# Patient Record
Sex: Female | Born: 1995 | Race: Black or African American | Hispanic: No | Marital: Single | Smoking: Never smoker
Health system: Southern US, Community
[De-identification: ages and names within clinical notes are randomized; demographics above are authoritative.]

---
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
## 2015-03-19 ENCOUNTER — Encounter (HOSPITAL_COMMUNITY): Payer: Self-pay | Admitting: Emergency Medicine

## 2015-03-19 ENCOUNTER — Emergency Department (HOSPITAL_COMMUNITY)
Admission: EM | Admit: 2015-03-19 | Discharge: 2015-03-19 | Disposition: A | Discharge status: Home / Self Care | Payer: PRIVATE HEALTH INSURANCE | Attending: Emergency Medicine | Admitting: Emergency Medicine

## 2015-03-19 ENCOUNTER — Emergency Department (HOSPITAL_COMMUNITY): Payer: PRIVATE HEALTH INSURANCE

## 2015-03-19 DIAGNOSIS — R05 Cough: Secondary | ICD-10-CM | POA: Diagnosis present

## 2015-03-19 DIAGNOSIS — R059 Cough, unspecified: Secondary | ICD-10-CM

## 2015-03-19 DIAGNOSIS — R0789 Other chest pain: Secondary | ICD-10-CM | POA: Diagnosis not present

## 2015-03-19 DIAGNOSIS — R062 Wheezing: Secondary | ICD-10-CM | POA: Insufficient documentation

## 2015-03-19 MED ORDER — BENZONATATE 100 MG PO CAPS: 200.0000 mg | ORAL_CAPSULE | Freq: Two times a day (BID) | ORAL | Status: AC | PRN

## 2015-03-19 MED ORDER — FAMOTIDINE 20 MG PO TABS: 20.0000 mg | ORAL_TABLET | Freq: Two times a day (BID) | ORAL | Status: AC

## 2015-03-19 NOTE — ED Provider Notes (Signed)
CSN: [id number]     Arrival date & time 03/19/15  1712 History  By signing my name below, I, Gonzella Lex, attest that this documentation has been prepared under the direction and in the presence of Barrett Henle, PA-C. Electronically Signed: Gonzella Lex, Scribe. 03/19/2015. 7:31 PM.   Chief Complaint  Patient presents with  . Cough   The history is provided by the patient. No language interpreter was used.   HPI Comments: Mary Ashley is a 20 y.o. female with no pertinent past medical hx who presents to the Emergency Department complaining of nonproductive cough, onset 2 months. She notes her coughing when laying day at night. Pt also reports having mild, substernal chest pain, described as tightness. Pt also notes associated intermittent wheezing. Pt was seen by the nurse on her school's campus who prescribed her prednisone and cough medication which have provided her with no relief. She denies fever, nasal congestion, rhinorrhea, sore throat, hemoptysis, ear pain, SOB, abdominal pain, nausea, vomiting, diarrhea, watery eyes, recent injury to her chest, and hx of seasonal allergies.  Denies taking any other medications PTA.  History reviewed. No pertinent past medical history. History reviewed. No pertinent past surgical history. No family history on file. Social History  Substance Use Topics  . Smoking status: Never Smoker   . Smokeless tobacco: None  . Alcohol Use: No   OB History    No data available     Review of Systems  Constitutional: Negative for fever.  HENT: Negative for congestion, ear pain, rhinorrhea and sore throat.   Eyes: Negative for discharge.  Respiratory: Positive for cough and chest tightness.   Cardiovascular: Positive for chest pain.  Gastrointestinal: Negative for nausea, vomiting, abdominal pain and diarrhea.   Allergies  Review of patient's allergies indicates no known allergies.  Home Medications   Prior to Admission  medications   Medication Sig Start Date End Date Taking? Authorizing Provider  benzonatate (TESSALON) 100 MG capsule Take 2 capsules (200 mg total) by mouth 2 (two) times daily as needed for cough. 03/19/15   Barrett Henle, PA-C  famotidine (PEPCID) 20 MG tablet Take 1 tablet (20 mg total) by mouth 2 (two) times daily. 03/19/15   Satira Sark Nadeau, PA-C   BP 117/79 mmHg  Pulse 75  Temp(Src) 98.5 F (36.9 C) (Oral)  Resp 18  SpO2 99%  LMP 02/27/2015 Physical Exam  Constitutional: She is oriented to person, place, and time. She appears well-developed and well-nourished. No distress.  HENT:  Head: Normocephalic and atraumatic.  Right Ear: External ear normal.  Left Ear: External ear normal.  Nose: Nose normal.  Mouth/Throat: Oropharynx is clear and moist. No oropharyngeal exudate.  Eyes: Conjunctivae and EOM are normal. Right eye exhibits no discharge. Left eye exhibits no discharge. No scleral icterus.  Neck: Normal range of motion. Neck supple.  Cardiovascular: Normal rate, regular rhythm, normal heart sounds and intact distal pulses.  Exam reveals no gallop and no friction rub.   No murmur heard. Pulmonary/Chest: Effort normal and breath sounds normal. No respiratory distress. She has no wheezes. She has no rales. She exhibits tenderness ( anterior chest wall mildly tender with palpation  ).  Abdominal: Soft. Bowel sounds are normal. She exhibits no distension. There is no tenderness.  Musculoskeletal: Normal range of motion. She exhibits no edema.  Neurological: She is alert and oriented to person, place, and time.  Skin: Skin is warm and dry.  Psychiatric: She has a normal mood  and affect.  Nursing note and vitals reviewed.   ED Course  Procedures  DIAGNOSTIC STUDIES:    Oxygen Saturation is 98% on RA, normal by my interpretation.   COORDINATION OF CARE:  7:31 PM Will prescribe pt pepcid and cough medication. Discussed treatment plan with pt at bedside and pt  agreed to plan.   Imaging Review Dg Chest 2 View  03/19/2015  CLINICAL DATA:  Nonproductive cough for 2 months, central chest pain when coughing EXAM: CHEST  2 VIEW COMPARISON:  None. FINDINGS: Cardiomediastinal silhouette is unremarkable. No acute infiltrate or pleural effusion. No pulmonary edema. Bilateral nipple metallic pins. Bony thorax is unremarkable. IMPRESSION: No active cardiopulmonary disease. Electronically Signed   By: Natasha Mead M.D.   On: 03/19/2015 18:43   I have personally reviewed and evaluated these images as part of my medical decision-making.   MDM   Final diagnoses:  Cough    Pt presents with nonproductive cough x 2months with associated CP with coughing. No PMH. VSS. Exam revealed mild tenderness over anterior chest wall, remaining exam unremarkable. I suspect pt's cough may likely be due to GERD due to nonproductive cough worsening at night or when laying down s/p meal. Plan to d/c pt home with antitussive and pepcid. Pt given resource guide to follow up with PCP.   Evaluation does not show pathology requring ongoing emergent intervention or admission. Pt is hemodynamically stable and mentating appropriately. Discussed findings/results and plan with patient/guardian, who agrees with plan. All questions answered. Return precautions discussed and outpatient follow up given.    I personally performed the services described in this documentation, which was scribed in my presence. The recorded information has been reviewed and is accurate.    Satira Sark [place], [place] 03/20/15 1036  Bethann Berkshire, MD 03/22/15 [phone]

## 2015-03-19 NOTE — ED Notes (Addendum)
Pt complaint of continued nonproductive cough for two month; central chest pain ONLY with cough; denies fever.

## 2015-03-19 NOTE — Discharge Instructions (Signed)
Take your medications as prescribed. Please follow up with a primary care provider from the Resource Guide provided below in 1 week. Please return to the Emergency Department if symptoms worsen or new onset of fever, productive cough, coughing up blood, shortness of breath, wheezing, chest pain, lightheadedness, dizziness, syncope.   Emergency Department Resource Guide 1) Find a Doctor and Pay Out of Pocket Although you won't have to find out who is covered by your insurance plan, it is a good idea to ask around and get recommendations. You will then need to call the office and see if the doctor you have chosen will accept you as a new patient and what types of options they offer for patients who are self-pay. Some doctors offer discounts or will set up payment plans for their patients who do not have insurance, but you will need to ask so you aren't surprised when you get to your appointment.  2) Contact Your Local Health Department Not all health departments have doctors that can see patients for sick visits, but many do, so it is worth a call to see if yours does. If you don't know where your local health department is, you can check in your phone book. The CDC also has a tool to help you locate your state's health department, and many state websites also have listings of all of their local health departments.  3) Find a Walk-in Clinic If your illness is not likely to be very severe or complicated, you may want to try a walk in clinic. These are popping up all over the country in pharmacies, drugstores, and shopping centers. They're usually staffed by nurse practitioners or physician assistants that have been trained to treat common illnesses and complaints. They're usually fairly quick and inexpensive. However, if you have serious medical issues or chronic medical problems, these are probably not your best option.  No Primary Care Doctor: - Call Health Connect at  [phone] - they can help you  locate a primary care doctor that  accepts your insurance, provides certain services, etc. - Physician Referral Service- [phone]  Chronic Pain Problems: Organization         Address  Phone   Notes  [facility]  [phone] Patients need to be referred by their primary care doctor.   Medication Assistance: Organization         Address  Phone   Notes  [facility] Medication [facility] [address] [place], [place] [address] [phone] --Must be a resident of [facility] -- Must have NO insurance coverage whatsoever (no Medicaid/ Medicare, etc.) -- The pt. MUST have a primary care doctor that directs their care regularly and follows them in the community   [facility]ist  [phone]   [employer]  [phone]    Agencies that provide inexpensive medical care: Organization         Address  Phone   Notes  [facility]  [phone]   [facility]    [phone]   [facility] [address] [place], [place] [address] [phone]   [facility] [address] [place]. [address], [place] [phone]   Planned Parenthood    [phone]   [facility]    [phone]   [facility]  [address], [place] Phone:  [phone], Fax:  [phone] Hours of Operation:  9  am - 6 pm, M-F.  Also accepts Medicaid/Medicare and self-pay.  [facility]  [address], [place] Phone: [phone]) [phone], Fax: [phone]) [phone]. Hours of Operation:  8:30 am - 5:30 pm, M-F.  Also accepts Medicaid and self-pay.  [facility] [place] [address], [place] Phone: [phone]) [phone]   [facility] [address], [place], [place] [phone], Ext. [phone] Mondays & Thursdays: 7-9 AM.  First 15 patients are seen on a first come, first serve basis.    Medicaid-accepting [facility]  Providers:  Organization         Address  Phone   Notes  [facility] [address], [place] [phone]) [phone] Also accepts self-pay patients.  [facility] [address] [place], [place]  [phone]) [phone]   [facility] [address], [place] [phone](336) [phone]   [facility] [address] [place] [address], [place] [phone](336) [phone]   Renaye RakersVeita Bland [address], [place]   [phone] Only accepts [place] Access [place] patients after they have their name applied to their card.   Self-Pay (no insurance) in [facility]:  Organization         Address  Phone   Notes  Sickle Cell Patients, [facility] [address], [place] [phone]) [phone]   [facility] [address], [place] [phone]) [phone]   [facility] [place]  [address], [place] [phone]) [phone]   [facility]/Dr. Osei-Bonsu  [address] [place] [address], [place] or [phone], [place] [phone]) [phone] Phone number for both [place] and [place] locations is the same.  [facility] [address], [place] [phone]) [phone]   [facility] [place] [address] [place] [address], [place] or [address] [phone]) [phone]   [facility] [address], [place] [phone]) [phone], phone; [phone]) [phone], fax Sees patients 1st and 3rd Saturday of every month.  Must not qualify for public or private insurance (i.e. Medicaid, Medicare, [place] Health Choice, Veterans' Benefits)  Household income should be no more than 200% of the poverty level The clinic cannot treat you if you are pregnant or think you are pregnant  Sexually transmitted diseases are not treated at the clinic.    Dental Care: Organization         Address  Phone  Notes  [facility] [address], [place] [phone] Accepts children up to age 20 who are enrolled in [place]icaid or [place] Health Choice; pregnant women with a Medicaid card; and children who have applied for Medicaid or [place] Health Choice, but were declined, whose parents can pay a reduced fee at time of service.  [facility] [place]  [address], [place] [phone] Accepts children up to age 20 who are enrolled in [place] or [place] Health Choice; pregnant women with a Medicaid card; and children who have applied for Medicaid or [place] Health Choice, but were declined, whose parents can pay a reduced fee at time of service.  [facility]  [address], [place] [phone]) [phone] Patients are seen by appointment only. Walk-ins are not accepted. [facility] will see patients 18 years of  age and older. Monday - Tuesday (8am-5pm) Most Wednesdays (8:30-5pm) $30 per visit, cash only  [facility]  [address], [facility] [phone] Patients are seen by appointment only. Walk-ins are not accepted. [place] will see patients 67 years of age and older. One Wednesday Evening (Monthly: Volunteer Based).  $30 per visit, cash only  [place]  [phone] for adults; Children under age 56, call Graduate Pediatric Dentistry at [phone]. Children aged 3-14, please call [phone] to request a pediatric application.  Dental services are provided in all areas of dental care including fillings, crowns and bridges, complete and partial dentures, implants, gum treatment, root canals, and extractions. Preventive care is also provided. Treatment is provided to both adults and children. Patients are selected via a lottery and there is often a waiting list.   [facility] [address] [place]  [phone] [email or web address]   [facility]  [address] [place], [place] [phone], Ext. [phone] Second and Fourth Thursday of each month, opens at 6:30 AM; Clinic ends at 9 AM.  Patients are seen on a first-come first-served basis, and a limited number are seen during each clinic.   [facility]  [address] Danker [place], [place] [phone]   Eligibility Requirements You must have lived in [place], [place], or [place] counties for at least the last three months.   You cannot be eligible for state or federal sponsored [employer], including [employer], [place], or [employer].   You generally cannot be eligible for healthcare insurance through your employer.    How to apply: Eligibility screenings are held every Tuesday and Wednesday afternoon from 1:00 pm until 4:00 pm. You do not need an appointment for the interview!  [facility] [address] [place], [place]   [place]  [place] Department  [place]  [phone]    Behavioral Health Resources in the Community: Intensive Outpatient Programs Organization         Address  Phone  Notes  [place] Fisher. [address], [place], [place] [phone]   [facility] Outpatient [address], [place], [place]   ADS: Alcohol & Drug Svcs [address] [place], [place]   [place] [address],  [place], [place] or [phone]   Substance Abuse Resources Organization         Address  Phone  Notes  Alcohol and Drug Services  [phone]   [place]  [phone]   [facility] [place]   Chinita Pester  [phone]   Residential & Outpatient Substance Abuse Program  [phone]   Psychological Services Organization         Address  Phone  Notes  [facility]  [employer]  [phone]   [place] [address], [place] or [phone]    Mobile Crisis Teams Organization         Address  Phone  Notes  [facility], Mobile Crisis Care Unit  [phone]   [facility]  [address] [place], [place]   [facility] [address] [place] [phone]    Self-Help/Support Groups Organization  Address  Phone             Notes  [place] of [place] - variety of support groups  [place] Call for more information  Narcotics Anonymous (NA), [facility] [address], [employer] [place]  2 meetings at this location   Special educational needs teacher         Address  Phone  Notes  ASAP Residential Treatment [place],    [place]  [phone]   [facility]  [address], [place] [address], [place], [place]   [place], [place] [phone] Admissions: 8am-3pm M-F  Incentives Substance [place] [address]    [place], [place] [phone]   [facility] [address] [place], [place], [place]   [facility] [address],  [place], [place]   [facility] - Intensive Outpatient [address], [place], [place]   [facility] ([facility] [address].,  [place], [place] [phone] or [phone]   [facility] (RTS) [address], [place], [place] Accepts Medicaid  [facility] [address],  [place] [phone] Substance Abuse/Addiction Treatment   [facility] Organization         Address  Phone  Notes  [facility]  [phone]   Domenic Schwab, PhD [address] [place], [place]   [phone] or [phone]    [place], [place] [phone]   [facility] [address], [place], [place] [phone] Insurance/Medicaid/sponsorship through [employer] and Families [address], [address]                                    [place], [place] [phone] [place] [address], [place] [phone]    Dr. Adele Schilder  [phone]   [facility] Dept. 1) [address], [place] 2) [place] 3)  [place] [address], [place] [phone]  [phone]   [place] [phone] or [phone] (After Hours)

## 2017-02-02 IMAGING — CR DG CHEST 2V
2 series · 2 of 2 positions shown · non-contrast
Comparison: None.

CLINICAL DATA: Nonproductive cough for 2 months, central chest pain
when coughing

EXAM:
CHEST  2 VIEW

[w chest pa]
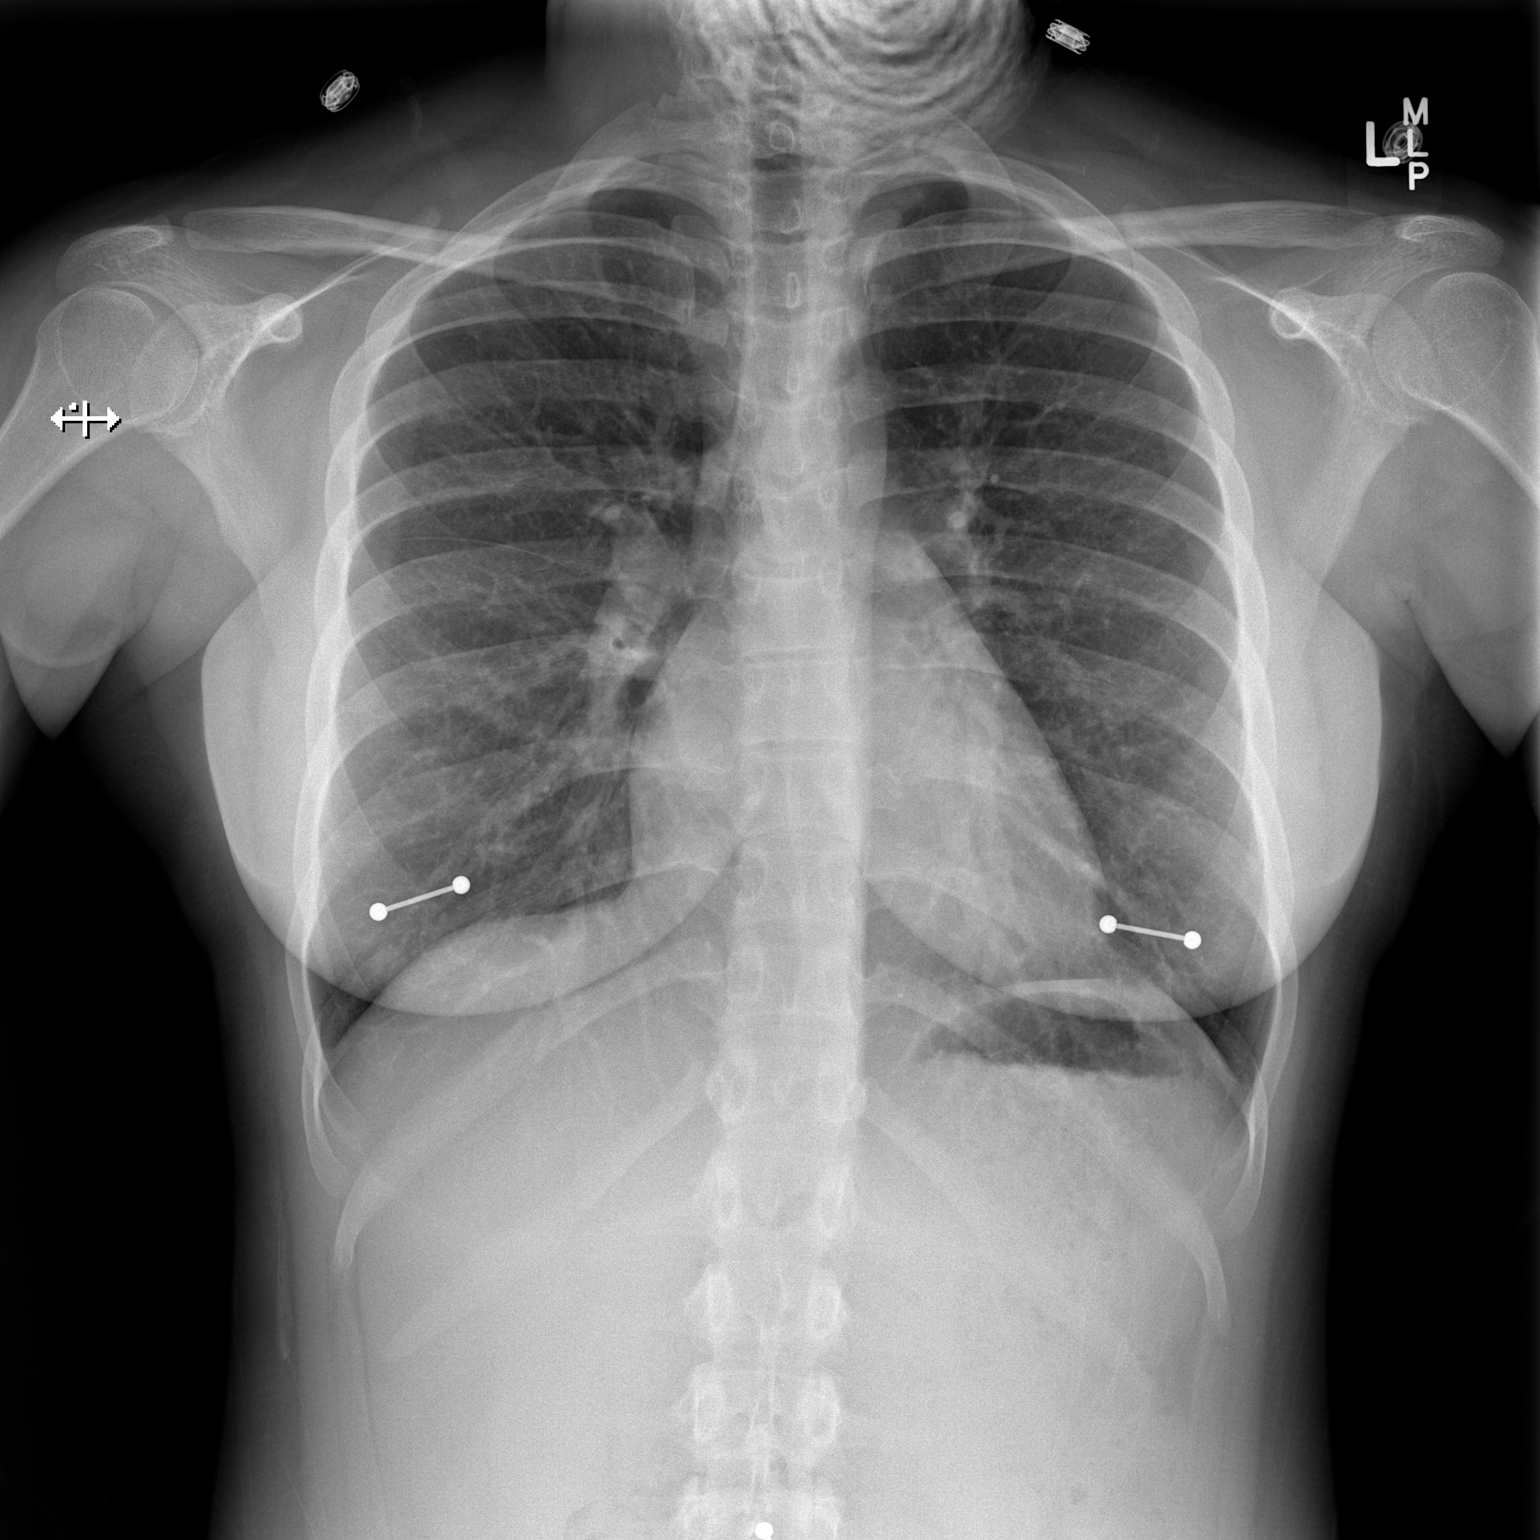

[w chest lat]
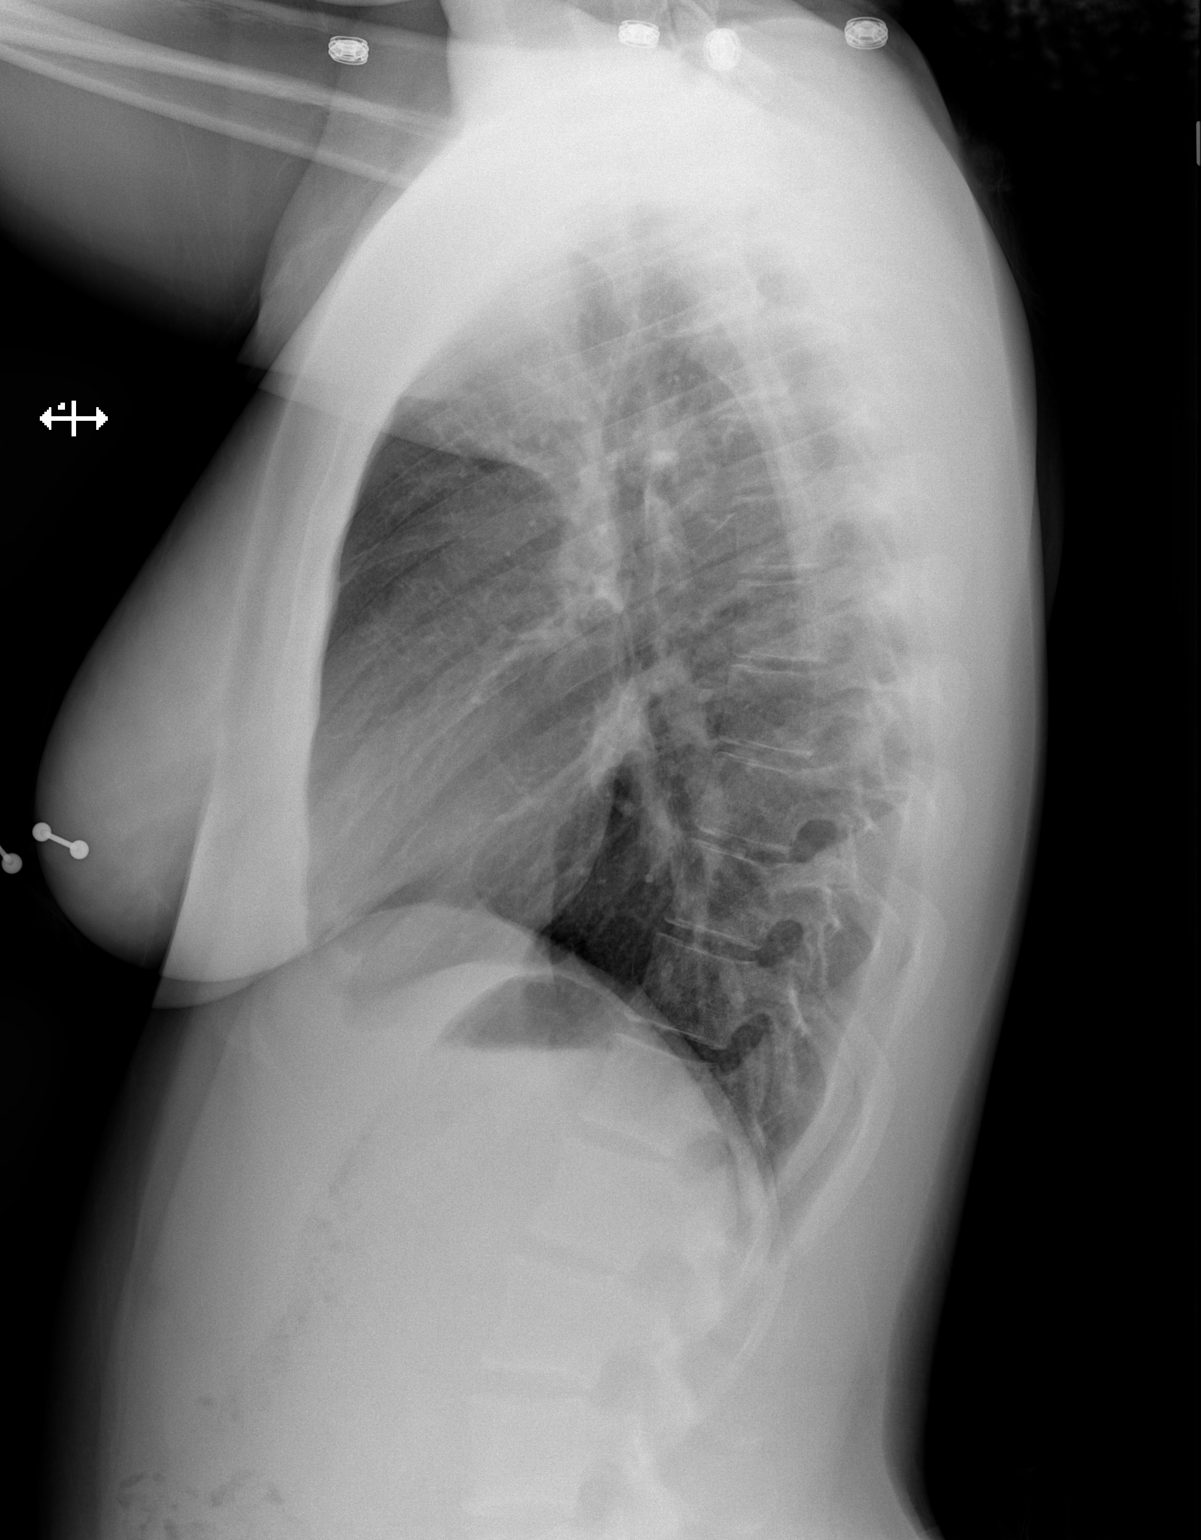

[2 of 2 positions shown; findings below may reference images not displayed]

FINDINGS: Cardiomediastinal silhouette is unremarkable. No acute infiltrate or
pleural effusion. No pulmonary edema. Bilateral nipple metallic
pins. Bony thorax is unremarkable.
IMPRESSION: No active cardiopulmonary disease.
